# Patient Record
Sex: Male | Born: 1970 | Race: Black or African American | Hispanic: No | Marital: Married | Smoking: Never smoker
Health system: Southern US, Community
[De-identification: ages and names within clinical notes are randomized; demographics above are authoritative.]

## PROBLEM LIST (undated history)

## (undated) HISTORY — PX: HAND SURGERY: SHX662

---

## 1997-08-30 ENCOUNTER — Emergency Department (HOSPITAL_COMMUNITY): Admission: EM | Admit: 1997-08-30 | Discharge: 1997-08-30 | Payer: Self-pay | Admitting: *Deleted

## 1999-12-16 ENCOUNTER — Emergency Department (HOSPITAL_COMMUNITY): Admission: EM | Admit: 1999-12-16 | Discharge: 1999-12-16 | Payer: Self-pay | Admitting: Emergency Medicine

## 1999-12-16 ENCOUNTER — Encounter: Payer: Self-pay | Admitting: Emergency Medicine

## 1999-12-22 ENCOUNTER — Ambulatory Visit (HOSPITAL_COMMUNITY): Admission: RE | Admit: 1999-12-22 | Discharge: 1999-12-22 | Payer: Self-pay | Admitting: Orthopedic Surgery

## 1999-12-22 ENCOUNTER — Encounter: Payer: Self-pay | Admitting: Orthopedic Surgery

## 2004-09-23 ENCOUNTER — Emergency Department: Payer: Self-pay | Admitting: Unknown Physician Specialty

## 2006-01-17 ENCOUNTER — Emergency Department (HOSPITAL_COMMUNITY): Admission: EM | Admit: 2006-01-17 | Discharge: 2006-01-17 | Payer: Self-pay | Admitting: Emergency Medicine

## 2006-04-05 ENCOUNTER — Emergency Department (HOSPITAL_COMMUNITY): Admission: EM | Admit: 2006-04-05 | Discharge: 2006-04-05 | Payer: Self-pay | Admitting: Emergency Medicine

## 2006-07-23 ENCOUNTER — Emergency Department (HOSPITAL_COMMUNITY): Admission: EM | Admit: 2006-07-23 | Discharge: 2006-07-23 | Payer: Self-pay | Admitting: Emergency Medicine

## 2010-10-04 ENCOUNTER — Emergency Department: Payer: Self-pay | Admitting: Unknown Physician Specialty

## 2010-10-16 NOTE — Op Note (Signed)
[facility]  Patient:    Nathan Lutz, Nathan Lutz                       MRN: [id number] Proc. Date: 12/22/99 Adm. Date:  [id number] Attending:  Dominica Severin                           Operative Report  PREOPERATIVE DIAGNOSES: 1. Left displaced index metacarpal fracture. 2. Right ring finger volar dislocation with extensor tendon disruption, and    status post initial closed reduction with recurrence of the deformity.  POSTOPERATIVE DIAGNOSES: 1. Left displaced index metacarpal fracture. 2. Right ring finger volar dislocation with extensor tendon disruption, and    status post initial closed reduction with recurrence of the deformity.  PROCEDURES: 1. Intermedullary nailing, left index finger metacarpal fracture through an    open proximal incision about the base of the left metacarpal. 2. Right ring finger volar dislocation reduction and pinning. 3. Extensor tendon reconstruction, right ring finger, status post volar    dislocation, and extensor tendon disruption.  SURGEON:  Elisha Ponder, M.D.  ASSISTANT:  Cherly Beach.  COMPLICATIONS:  None.  ANESTHESIA:  General.  ESTIMATED BLOOD LOSS:  Minimal.  DRAINS:  None.  INDICATIONS FOR PROCEDURE:  The patient is a pleasant 40 year old black male who presents with the above mentioned diagnosis.  I have counseled in regards to the right upper extremity at length.  He has a persistent volar dislocation.  It has been reduced.  However, the splint came off and he presented to the hospital without his splint and with recurrence of the dislocation about the PIP.  I counseled him that I felt that the extensor tendon was disrupted, this was unstable, and that pinning and open repair of the extensor tendon would be required.  He understood risks and benefits and desired to proceed.  As far the left index finger metacarpal fracture, it is acutely displaced.  He understands this and risks and benefits of  surgery.  We will proceed with surgery as outlined by myself to the patient.  He understands the risks and benefits of surgery as discussed with him at length.  OPERATIVE FINDINGS:  The patient had a displaced left index finger metacarpal fracture.  This was treated with intermedullary nailing.  The patient underwent intermedullary nail plus wire placement through the hand innervation hardware.  The patient had good stabilization after this.  The right ring finger underwent reduction and reconstruction of the extensor tendon apparatus.  The patients PIP joint was pinned.  The extensor tendon was reconstructed with 4-0 Mersilene.  The patient interestingly had a longitudinal rent that produced a volar and ulna dislocation.  The joint was debrided and reconstruction accomplished without difficulty.  DESCRIPTION OF PROCEDURE:  The patient was seen by myself and anesthesia preoperatively.  I counseled him in regards to both upper extremity predicaments.  Given the persistent volar dislocation and recurrence about the right ring finger, I felt that open reduction, pinning, and reconstruction of the extensor tendon was necessary, and I discussed this with him at length. He understood this.  He was taken to the operative suite and underwent antibiotic _______ in the form of Ancef.  Following this, the patient underwent a smooth induction of anesthesia with _______, appropriately padded, and prepped and draped in the usual sterile fashion about both upper extremities.  Betadine scrub followed by painting and draping was accomplished.  Once  this was done, the patient had the operation ensued about the left upper extremity with the tourniquet elevated at 250 mmHg.  A small oblique incision was made at the index finger metacarpal base.  This was done without difficulty and blunt dissection was carried down and the ECRL tendon was identified, and a small split in it was made, and the patient then  had entry awl used to introduce an intermedullary guide rod for metacarpal fixation.  Metacarpal fracture which was transverse at mid-shaft level was then reduced.  The wire was placed across this which afforded excellent fixation.  It was driven into the head and secured.  It was then bent and placed in the subcutaneous tissue in a bent fashion to avoid injury to the adjacent extensor tendons.  This did not encroach upon the EIP or EDC.  The patient had x-ray taken for permanent documentation, AP, lateral, and oblique views.  This showed an excellent reduction.  I was very satisfied with the reduction and stabilization.  At this point in time, the tourniquet was deflated and hemostasis obtained. Irrigation applied.  The wound was then closed with an interrupted nylon suture.  The patient then had placement of a sterile dressing.  Following this, the patient had attention directed towards the right ring finger.  The tourniquet was inflated.  The patient had a dorsal incision made after fluoroscopy was performed with evaluation under fluoroscopy.  The incision showed a longitudinal rent in the extensor tendon.  This rendered the dorsal apparatus incompetent, and the patient had tendency towards a volar and ulna dislocation.  The patient did not pull the entire extensor tendon off of the central slip insertion on to the middle phalanx.  Rather, there was a large longitudinal splint which was rendering the finger unstable.  With this noted, the patient then underwent irrigation and debridement of the joint where soft tissue and blood debris were noted.  Inflow of the extensor tendon was removed and the patient then had the reduction held and a 0.035 K-wire was placed across the joint.  At this time, further irrigation was applied followed by reconstruction of the extensor tendon and longitudinal rent with 4-0 Mersilene suture.  This approximated well without difficulty.  I felt good about  the central slip insertion on to the middle phalanx.  This was fairly secure.  After this was done, the patient had tourniquet deflated.  Hemostasis was  obtained with bipolar electrocautery and the wound was then closed with interrupted nylon suture.  Excellent capillary refill was noted.  Finger ______ was excellent.  The pin was bent.  Pin caps were placed and the patient then had a sterile dressing applied.  The patient then had a plaster slab applied to both lower extremities, both dorsal and volar flaps with the hand in a flexion position was applied without difficulty.  All sponge and instrument counts were reported as correct and there were no immediate operative complications.  The patient will be kept on pain medicine, muscle relaxant, and antibiotic as appropriate, and was noted to be extubated in the recovery room in stable condition.  All sponge and instrument counts were reported as correct. DD:  12/22/99 TD:  12/24/99 Job: [id number]

## 2018-04-15 ENCOUNTER — Emergency Department (HOSPITAL_COMMUNITY)
Admission: EM | Admit: 2018-04-15 | Discharge: 2018-04-15 | Disposition: A | Discharge status: Home / Self Care | Payer: No Typology Code available for payment source | Attending: Emergency Medicine | Admitting: Emergency Medicine

## 2018-04-15 ENCOUNTER — Emergency Department (HOSPITAL_COMMUNITY): Payer: No Typology Code available for payment source

## 2018-04-15 ENCOUNTER — Encounter (HOSPITAL_COMMUNITY): Payer: Self-pay | Admitting: Emergency Medicine

## 2018-04-15 DIAGNOSIS — Y92410 Unspecified street and highway as the place of occurrence of the external cause: Secondary | ICD-10-CM | POA: Insufficient documentation

## 2018-04-15 DIAGNOSIS — B9789 Other viral agents as the cause of diseases classified elsewhere: Secondary | ICD-10-CM | POA: Insufficient documentation

## 2018-04-15 DIAGNOSIS — E876 Hypokalemia: Secondary | ICD-10-CM | POA: Insufficient documentation

## 2018-04-15 DIAGNOSIS — Y939 Activity, unspecified: Secondary | ICD-10-CM | POA: Diagnosis not present

## 2018-04-15 DIAGNOSIS — S20212A Contusion of left front wall of thorax, initial encounter: Secondary | ICD-10-CM | POA: Insufficient documentation

## 2018-04-15 DIAGNOSIS — J069 Acute upper respiratory infection, unspecified: Secondary | ICD-10-CM | POA: Diagnosis not present

## 2018-04-15 DIAGNOSIS — S20302A Unspecified superficial injuries of left front wall of thorax, initial encounter: Secondary | ICD-10-CM | POA: Diagnosis present

## 2018-04-15 DIAGNOSIS — Y999 Unspecified external cause status: Secondary | ICD-10-CM | POA: Diagnosis not present

## 2018-04-15 LAB — BASIC METABOLIC PANEL
Anion gap: 7 (ref 5–15)
BUN: 22 mg/dL — AB (ref 6–20)
CHLORIDE: 108 mmol/L (ref 98–111)
CO2: 24 mmol/L (ref 22–32)
CREATININE: 1.11 mg/dL (ref 0.61–1.24)
Calcium: 8.7 mg/dL — ABNORMAL LOW (ref 8.9–10.3)
GFR calc Af Amer: >60 mL/min (ref >60)
GFR calc non Af Amer: >60 mL/min (ref >60)
GLUCOSE: 157 mg/dL — AB (ref 70–99)
POTASSIUM: 3.2 mmol/L — AB (ref 3.5–5.1)
Sodium: 139 mmol/L (ref 135–145)

## 2018-04-15 LAB — CBC WITH DIFFERENTIAL/PLATELET
ABS IMMATURE GRANULOCYTES: 0.04 10*3/uL (ref 0.00–0.07)
Basophils Absolute: 0 10*3/uL (ref 0.0–0.1)
Basophils Relative: 1 %
EOS PCT: 1 %
Eosinophils Absolute: 0.1 10*3/uL (ref 0.0–0.5)
HCT: 44.5 % (ref 39.0–52.0)
HEMOGLOBIN: 14.4 g/dL (ref 13.0–17.0)
Immature Granulocytes: 1 %
LYMPHS PCT: 15 %
Lymphs Abs: 1.3 10*3/uL (ref 0.7–4.0)
MCH: 28.3 pg (ref 26.0–34.0)
MCHC: 32.4 g/dL (ref 30.0–36.0)
MCV: 87.6 fL (ref 80.0–100.0)
MONO ABS: 0.6 10*3/uL (ref 0.1–1.0)
MONOS PCT: 7 %
NEUTROS ABS: 6.5 10*3/uL (ref 1.7–7.7)
Neutrophils Relative %: 75 %
Platelets: 288 10*3/uL (ref 150–400)
RBC: 5.08 MIL/uL (ref 4.22–5.81)
RDW: 13.2 % (ref 11.5–15.5)
WBC: 8.6 10*3/uL (ref 4.0–10.5)
nRBC: 0 % (ref 0.0–0.2)

## 2018-04-15 LAB — I-STAT TROPONIN, ED: Troponin i, poc: 0 ng/mL (ref 0.00–0.08)

## 2018-04-15 LAB — D-DIMER, QUANTITATIVE (NOT AT ARMC)

## 2018-04-15 MED ORDER — METHOCARBAMOL 500 MG PO TABS: 500.0000 mg | ORAL_TABLET | Freq: Two times a day (BID) | ORAL | 0 refills | Status: AC

## 2018-04-15 MED ORDER — TRAMADOL HCL 50 MG PO TABS: 50.0000 mg | ORAL_TABLET | Freq: Four times a day (QID) | ORAL | 0 refills | Status: AC | PRN

## 2018-04-15 MED ORDER — POTASSIUM CHLORIDE CRYS ER 20 MEQ PO TBCR
40.0000 meq | EXTENDED_RELEASE_TABLET | Freq: Once | ORAL | Status: AC
  Administered 2018-04-15: 40 meq via ORAL
  Filled 2018-04-15: qty 2

## 2018-04-15 MED ORDER — BENZONATATE 100 MG PO CAPS: 200.0000 mg | ORAL_CAPSULE | Freq: Three times a day (TID) | ORAL | 0 refills | Status: AC

## 2018-04-15 NOTE — ED Notes (Signed)
Pt back from X-ray.  
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
## 2018-04-15 NOTE — ED Notes (Signed)
Patient verbalized understanding of dc instructions and RXs, vss, resp e/u, nad. Ambulatory upon discharge.

## 2018-04-15 NOTE — ED Notes (Signed)
Patient transported to X-ray 
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
